# Patient Record
Sex: Female | Born: 1990 | Race: White | Hispanic: No | Marital: Single | Smoking: Never smoker
Health system: Southern US, Community
[De-identification: ages and names within clinical notes are randomized; demographics above are authoritative.]

## PROBLEM LIST (undated history)

## (undated) DIAGNOSIS — E079 Disorder of thyroid, unspecified: Secondary | ICD-10-CM

---

## 2020-06-30 DIAGNOSIS — Z7689 Persons encountering health services in other specified circumstances: Secondary | ICD-10-CM | POA: Diagnosis not present

## 2020-06-30 DIAGNOSIS — Z1329 Encounter for screening for other suspected endocrine disorder: Secondary | ICD-10-CM | POA: Diagnosis not present

## 2020-06-30 DIAGNOSIS — Z13220 Encounter for screening for lipoid disorders: Secondary | ICD-10-CM | POA: Diagnosis not present

## 2020-06-30 DIAGNOSIS — E039 Hypothyroidism, unspecified: Secondary | ICD-10-CM | POA: Diagnosis not present

## 2020-06-30 DIAGNOSIS — F419 Anxiety disorder, unspecified: Secondary | ICD-10-CM | POA: Diagnosis not present

## 2020-06-30 DIAGNOSIS — B001 Herpesviral vesicular dermatitis: Secondary | ICD-10-CM | POA: Diagnosis not present

## 2020-06-30 DIAGNOSIS — Z6841 Body Mass Index (BMI) 40.0 and over, adult: Secondary | ICD-10-CM | POA: Diagnosis not present

## 2020-06-30 DIAGNOSIS — Z23 Encounter for immunization: Secondary | ICD-10-CM | POA: Diagnosis not present

## 2020-07-04 DIAGNOSIS — Z419 Encounter for procedure for purposes other than remedying health state, unspecified: Secondary | ICD-10-CM | POA: Diagnosis not present

## 2020-07-22 DIAGNOSIS — E039 Hypothyroidism, unspecified: Secondary | ICD-10-CM | POA: Diagnosis not present

## 2020-07-22 DIAGNOSIS — Z13220 Encounter for screening for lipoid disorders: Secondary | ICD-10-CM | POA: Diagnosis not present

## 2020-07-26 DIAGNOSIS — F418 Other specified anxiety disorders: Secondary | ICD-10-CM | POA: Diagnosis not present

## 2020-07-26 DIAGNOSIS — E039 Hypothyroidism, unspecified: Secondary | ICD-10-CM | POA: Diagnosis not present

## 2020-07-26 DIAGNOSIS — Z0000 Encounter for general adult medical examination without abnormal findings: Secondary | ICD-10-CM | POA: Diagnosis not present

## 2020-07-26 DIAGNOSIS — Z975 Presence of (intrauterine) contraceptive device: Secondary | ICD-10-CM | POA: Diagnosis not present

## 2020-07-26 DIAGNOSIS — F419 Anxiety disorder, unspecified: Secondary | ICD-10-CM | POA: Diagnosis not present

## 2020-08-03 DIAGNOSIS — Z419 Encounter for procedure for purposes other than remedying health state, unspecified: Secondary | ICD-10-CM | POA: Diagnosis not present

## 2020-08-17 DIAGNOSIS — B9689 Other specified bacterial agents as the cause of diseases classified elsewhere: Secondary | ICD-10-CM | POA: Diagnosis not present

## 2020-08-17 DIAGNOSIS — Z124 Encounter for screening for malignant neoplasm of cervix: Secondary | ICD-10-CM | POA: Diagnosis not present

## 2020-08-17 DIAGNOSIS — Z975 Presence of (intrauterine) contraceptive device: Secondary | ICD-10-CM | POA: Diagnosis not present

## 2020-08-17 DIAGNOSIS — N644 Mastodynia: Secondary | ICD-10-CM | POA: Diagnosis not present

## 2020-08-17 DIAGNOSIS — N760 Acute vaginitis: Secondary | ICD-10-CM | POA: Diagnosis not present

## 2020-08-17 DIAGNOSIS — Z0001 Encounter for general adult medical examination with abnormal findings: Secondary | ICD-10-CM | POA: Diagnosis not present

## 2020-08-17 DIAGNOSIS — A5909 Other urogenital trichomoniasis: Secondary | ICD-10-CM | POA: Diagnosis not present

## 2020-08-17 DIAGNOSIS — Z113 Encounter for screening for infections with a predominantly sexual mode of transmission: Secondary | ICD-10-CM | POA: Diagnosis not present

## 2020-08-31 DIAGNOSIS — B349 Viral infection, unspecified: Secondary | ICD-10-CM | POA: Diagnosis not present

## 2020-08-31 DIAGNOSIS — Z20822 Contact with and (suspected) exposure to covid-19: Secondary | ICD-10-CM | POA: Diagnosis not present

## 2020-09-03 DIAGNOSIS — Z419 Encounter for procedure for purposes other than remedying health state, unspecified: Secondary | ICD-10-CM | POA: Diagnosis not present

## 2020-10-04 DIAGNOSIS — Z419 Encounter for procedure for purposes other than remedying health state, unspecified: Secondary | ICD-10-CM | POA: Diagnosis not present

## 2020-11-01 DIAGNOSIS — Z419 Encounter for procedure for purposes other than remedying health state, unspecified: Secondary | ICD-10-CM | POA: Diagnosis not present

## 2020-12-02 DIAGNOSIS — Z419 Encounter for procedure for purposes other than remedying health state, unspecified: Secondary | ICD-10-CM | POA: Diagnosis not present

## 2021-01-01 DIAGNOSIS — Z419 Encounter for procedure for purposes other than remedying health state, unspecified: Secondary | ICD-10-CM | POA: Diagnosis not present

## 2021-02-01 DIAGNOSIS — Z419 Encounter for procedure for purposes other than remedying health state, unspecified: Secondary | ICD-10-CM | POA: Diagnosis not present

## 2021-02-20 ENCOUNTER — Encounter: Payer: Self-pay | Admitting: Emergency Medicine

## 2021-02-20 ENCOUNTER — Other Ambulatory Visit: Payer: Self-pay

## 2021-02-20 ENCOUNTER — Ambulatory Visit
Admission: EM | Admit: 2021-02-20 | Discharge: 2021-02-20 | Disposition: A | Discharge status: Home / Self Care | Payer: PRIVATE HEALTH INSURANCE | Attending: Emergency Medicine | Admitting: Emergency Medicine

## 2021-02-20 DIAGNOSIS — J069 Acute upper respiratory infection, unspecified: Secondary | ICD-10-CM | POA: Insufficient documentation

## 2021-02-20 DIAGNOSIS — J029 Acute pharyngitis, unspecified: Secondary | ICD-10-CM | POA: Diagnosis present

## 2021-02-20 LAB — POCT RAPID STREP A (OFFICE): Rapid Strep A Screen: NEGATIVE

## 2021-02-20 MED ORDER — FLUTICASONE PROPIONATE 50 MCG/ACT NA SUSP: 1.0000 | Freq: Every day | NASAL | 0 refills | Status: AC

## 2021-02-20 MED ORDER — IBUPROFEN 600 MG PO TABS: 600.0000 mg | ORAL_TABLET | Freq: Four times a day (QID) | ORAL | 0 refills | Status: DC | PRN

## 2021-02-20 NOTE — Discharge Instructions (Addendum)
Strep negative, COVID test pending for confirmation Rest and fluids Flonase and daily antihistamine such as Zyrtec or Claritin to help with congestion and drainage Ibuprofen and Tylenol for throat pain May use other over-the-counter medicine to help with cough and congestion as needed or if preferred Follow-up for any concerns

## 2021-02-20 NOTE — ED Triage Notes (Signed)
Pt here for bilateral ear pain and nasal congestion with scratchy throat x 3 days; pt sts neg covid test yesterday

## 2021-02-21 NOTE — ED Provider Notes (Signed)
[facility]    CSN: [id number] Arrival date & time: 02/20/21  1939      History   Chief Complaint Chief Complaint  Patient presents with   Otalgia    HPI Diane Sandoval is a 30 y.o. female presenting today for evaluation of URI symptoms.  Reports ear pressure, sore throat on the left side and congestion for the past 3 days.  Took at home COVID test which was negative last night.  She denies any fevers, but has had some slight hot and cold chills.  Denies any cough, chest pain or shortness of breath.  Denies GI symptoms.  Denies known exposures, but does work at a [employer], [employer].  HPI  History reviewed. No pertinent past medical history.  There are no problems to display for this patient.   History reviewed. No pertinent surgical history.  OB History   No obstetric history on file.      Home Medications    Prior to Admission medications   Medication Sig Start Date End Date Taking? Authorizing Provider  fluticasone (FLONASE) 50 MCG/ACT nasal spray Place 1-2 sprays into both nostrils daily. 02/20/21  Yes Bambie Pizzolato C, PA-C  ibuprofen (ADVIL) 600 MG tablet Take 1 tablet (600 mg total) by mouth every 6 (six) hours as needed. 02/20/21  Yes Kevonte Vanecek, Junius Creamer, PA-C    Family History History reviewed. No pertinent family history.  Social History Social History   Tobacco Use   Smoking status: Never   Smokeless tobacco: Never  Substance Use Topics   Alcohol use: Not Currently   Drug use: Never     Allergies   Patient has no known allergies.   Review of Systems Review of Systems  Constitutional:  Negative for activity change, appetite change, chills, fatigue and fever.  HENT:  Positive for congestion, rhinorrhea, sinus pressure and sore throat. Negative for ear pain and trouble swallowing.   Eyes:  Negative for discharge and redness.  Respiratory:  Negative for cough, chest tightness and shortness of breath.   Cardiovascular:  Negative for chest  pain.  Gastrointestinal:  Negative for abdominal pain, diarrhea, nausea and vomiting.  Musculoskeletal:  Negative for myalgias.  Skin:  Negative for rash.  Neurological:  Negative for dizziness, light-headedness and headaches.    Physical Exam Triage Vital Signs ED Triage Vitals  Enc Vitals Group     BP 02/20/21 1951 135/86     Pulse Rate 02/20/21 1951 (!) 105     Resp 02/20/21 1951 18     Temp 02/20/21 1951 98.7 F (37.1 C)     Temp Source 02/20/21 1951 Oral     SpO2 02/20/21 1951 97 %     Weight --      Height --      Head Circumference --      Peak Flow --      Pain Score 02/20/21 1952 6     Pain Loc --      Pain Edu? --      Excl. in GC? --    No data found.  Updated Vital Signs BP 135/86 (BP Location: Left Arm)   Pulse (!) 105   Temp 98.7 F (37.1 C) (Oral)   Resp 18   SpO2 97%   Visual Acuity Right Eye Distance:   Left Eye Distance:   Bilateral Distance:    Right Eye Near:   Left Eye Near:    Bilateral Near:     Physical Exam Vitals and nursing  note reviewed.  Constitutional:      Appearance: She is well-developed.     Comments: No acute distress  HENT:     Head: Normocephalic and atraumatic.     Ears:     Comments: Bilateral ears without tenderness to palpation of external auricle, tragus and mastoid, EAC's without erythema or swelling, TM's with good bony landmarks and cone of light. Non erythematous.      Nose: Nose normal.     Mouth/Throat:     Comments: Oral mucosa pink and moist, no tonsillar enlargement or exudate. Posterior pharynx patent and nonerythematous, no uvula deviation or swelling. Normal phonation.  Eyes:     Conjunctiva/sclera: Conjunctivae normal.  Cardiovascular:     Rate and Rhythm: Normal rate and regular rhythm.  Pulmonary:     Effort: Pulmonary effort is normal. No respiratory distress.     Comments: Breathing comfortably at rest, CTABL, no wheezing, rales or other adventitious sounds auscultated  Abdominal:      General: There is no distension.  Musculoskeletal:        General: Normal range of motion.     Cervical back: Neck supple.  Skin:    General: Skin is warm and dry.  Neurological:     Mental Status: She is alert and oriented to person, place, and time.     UC Treatments / Results  Labs (all labs ordered are listed, but only abnormal results are displayed) Labs Reviewed  CULTURE, GROUP A STREP ([facility])  NOVEL CORONAVIRUS, NAA  POCT RAPID STREP A (OFFICE)    EKG   Radiology No results found.  Procedures Procedures (including critical care time)  Medications Ordered in UC Medications - No data to display  Initial Impression / Assessment and Plan / UC Course  I have reviewed the triage vital signs and the nursing notes.  Pertinent labs & imaging results that were available during my care of the patient were reviewed by me and considered in my medical decision making (see chart for details).     Viral URI-strep negative, COVID pending to confirm, recommending continued symptomatic and supportive care rest and fluids.  Discussed strict return precautions. Patient verbalized understanding and is agreeable with plan.  Final Clinical Impressions(s) / UC Diagnoses   Final diagnoses:  Viral URI with cough  Sore throat     Discharge Instructions      Strep negative, COVID test pending for confirmation Rest and fluids Flonase and daily antihistamine such as Zyrtec or Claritin to help with congestion and drainage Ibuprofen and Tylenol for throat pain May use other over-the-counter medicine to help with cough and congestion as needed or if preferred Follow-up for any concerns     ED Prescriptions     Medication Sig Dispense Auth. Provider   fluticasone (FLONASE) 50 MCG/ACT nasal spray Place 1-2 sprays into both nostrils daily. 16 g Esteban Kobashigawa C, PA-C   ibuprofen (ADVIL) 600 MG tablet Take 1 tablet (600 mg total) by mouth every 6 (six) hours as needed. 30 tablet  Andrell Tallman, [place] C, PA-C      PDMP not reviewed this encounter.   Lew Dawes, [place] 02/21/21 [phone]

## 2021-02-22 LAB — NOVEL CORONAVIRUS, NAA: SARS-CoV-2, NAA: NOT DETECTED

## 2021-02-22 LAB — SARS-COV-2, NAA 2 DAY TAT

## 2021-02-24 LAB — CULTURE, GROUP A STREP (THRC)

## 2021-03-03 DIAGNOSIS — Z419 Encounter for procedure for purposes other than remedying health state, unspecified: Secondary | ICD-10-CM | POA: Diagnosis not present

## 2021-04-03 DIAGNOSIS — Z419 Encounter for procedure for purposes other than remedying health state, unspecified: Secondary | ICD-10-CM | POA: Diagnosis not present

## 2021-05-04 DIAGNOSIS — Z419 Encounter for procedure for purposes other than remedying health state, unspecified: Secondary | ICD-10-CM | POA: Diagnosis not present

## 2021-05-21 ENCOUNTER — Other Ambulatory Visit: Payer: Self-pay

## 2021-05-21 ENCOUNTER — Emergency Department (HOSPITAL_BASED_OUTPATIENT_CLINIC_OR_DEPARTMENT_OTHER): Payer: PRIVATE HEALTH INSURANCE

## 2021-05-21 ENCOUNTER — Emergency Department (HOSPITAL_BASED_OUTPATIENT_CLINIC_OR_DEPARTMENT_OTHER)
Admission: EM | Admit: 2021-05-21 | Discharge: 2021-05-21 | Disposition: A | Discharge status: Home / Self Care | Payer: PRIVATE HEALTH INSURANCE | Attending: Emergency Medicine | Admitting: Emergency Medicine

## 2021-05-21 ENCOUNTER — Encounter (HOSPITAL_BASED_OUTPATIENT_CLINIC_OR_DEPARTMENT_OTHER): Payer: Self-pay | Admitting: Emergency Medicine

## 2021-05-21 DIAGNOSIS — R112 Nausea with vomiting, unspecified: Secondary | ICD-10-CM | POA: Insufficient documentation

## 2021-05-21 DIAGNOSIS — R1013 Epigastric pain: Secondary | ICD-10-CM | POA: Diagnosis not present

## 2021-05-21 DIAGNOSIS — R197 Diarrhea, unspecified: Secondary | ICD-10-CM | POA: Insufficient documentation

## 2021-05-21 HISTORY — DX: Disorder of thyroid, unspecified: E07.9

## 2021-05-21 LAB — COMPREHENSIVE METABOLIC PANEL
ALT: 26 U/L (ref 0–44)
AST: 22 U/L (ref 15–41)
Albumin: 4.2 g/dL (ref 3.5–5.0)
Alkaline Phosphatase: 67 U/L (ref 38–126)
Anion gap: 7 (ref 5–15)
BUN: 9 mg/dL (ref 6–20)
CO2: 27 mmol/L (ref 22–32)
Calcium: 9.5 mg/dL (ref 8.9–10.3)
Chloride: 103 mmol/L (ref 98–111)
Creatinine, Ser: 0.82 mg/dL (ref 0.44–1.00)
GFR, Estimated: >60 mL/min (ref >60)
Glucose, Bld: 83 mg/dL (ref 70–99)
Potassium: 4.1 mmol/L (ref 3.5–5.1)
Sodium: 137 mmol/L (ref 135–145)
Total Bilirubin: 0.4 mg/dL (ref 0.3–1.2)
Total Protein: 7.1 g/dL (ref 6.5–8.1)

## 2021-05-21 LAB — URINALYSIS, ROUTINE W REFLEX MICROSCOPIC
Bilirubin Urine: NEGATIVE
Glucose, UA: NEGATIVE mg/dL
Ketones, ur: NEGATIVE mg/dL
Nitrite: NEGATIVE
Protein, ur: NEGATIVE mg/dL
Specific Gravity, Urine: 1.015 (ref 1.005–1.030)
pH: 6.5 (ref 5.0–8.0)

## 2021-05-21 LAB — URINALYSIS, MICROSCOPIC (REFLEX)

## 2021-05-21 LAB — CBC
HCT: 39.5 % (ref 36.0–46.0)
Hemoglobin: 13.2 g/dL (ref 12.0–15.0)
MCH: 30.4 pg (ref 26.0–34.0)
MCHC: 33.4 g/dL (ref 30.0–36.0)
MCV: 91 fL (ref 80.0–100.0)
Platelets: 264 10*3/uL (ref 150–400)
RBC: 4.34 MIL/uL (ref 3.87–5.11)
RDW: 12.9 % (ref 11.5–15.5)
WBC: 6.8 10*3/uL (ref 4.0–10.5)
nRBC: 0 % (ref 0.0–0.2)

## 2021-05-21 LAB — LIPASE, BLOOD: Lipase: <10 U/L — ABNORMAL LOW (ref 11–51)

## 2021-05-21 LAB — PREGNANCY, URINE: Preg Test, Ur: NEGATIVE

## 2021-05-21 MED ORDER — SODIUM CHLORIDE 0.9 % IV BOLUS
1000.0000 mL | Freq: Once | INTRAVENOUS | Status: AC
  Administered 2021-05-21: 1000 mL via INTRAVENOUS

## 2021-05-21 MED ORDER — SODIUM CHLORIDE 0.9 % IV BOLUS (SEPSIS): 1000.0000 mL | Freq: Once | INTRAVENOUS | Status: DC

## 2021-05-21 MED ORDER — IOHEXOL 350 MG/ML SOLN
75.0000 mL | Freq: Once | INTRAVENOUS | Status: AC | PRN
  Administered 2021-05-21: 75 mL via INTRAVENOUS

## 2021-05-21 MED ORDER — SODIUM CHLORIDE 0.9 % IV SOLN: 1000.0000 mL | INTRAVENOUS | Status: DC

## 2021-05-21 MED ORDER — ONDANSETRON HCL 4 MG/2ML IJ SOLN
4.0000 mg | Freq: Once | INTRAMUSCULAR | Status: AC
  Administered 2021-05-21: 4 mg via INTRAVENOUS
  Filled 2021-05-21: qty 2

## 2021-05-21 MED ORDER — MORPHINE SULFATE (PF) 2 MG/ML IV SOLN
2.0000 mg | Freq: Once | INTRAVENOUS | Status: AC
  Administered 2021-05-21: 2 mg via INTRAVENOUS
  Filled 2021-05-21: qty 1

## 2021-05-21 NOTE — ED Triage Notes (Signed)
Pt started wegovy a week ago.s he was started on large dose (1.7),30 minutes after pt was projectile vomiting. Pt is bloated, gassy, crampy,sore abdomen,cant eat because of  nausea. Pt worried she has pancreatitis which is listed as a potential side effect. Pt endorses abdominal pain across lower quadrants.

## 2021-05-21 NOTE — ED Provider Notes (Signed)
[facility] EMERGENCY DEPT Provider Note   CSN: [id number] Arrival date & time: 05/21/21  4098     History Chief Complaint  Patient presents with   Abdominal Pain    Diane Sandoval is a 30 y.o. female.  HPI 30 year old female G1, P1 LMP last year status post IUD placement presents today complaining of abdominal pain, nausea, vomiting, diarrhea.  She states she took her first dose of a weight loss medication last Sunday.  It is an injectable medication, regularly.  She states that after that for 48 hours she had nausea, vomiting, diarrhea.  After that resolved she began having crampy upper abdominal pain.  She is concerned that she has pancreatitis is this is listed as a side effect of the medication.  She has moderate epigastric crampy type pain.  She has not been able to take anything that has resolved this.  She has had decreased food intake as it does worsen with food.    Past Medical History:  Diagnosis Date   Thyroid disease     There are no problems to display for this patient.   History reviewed. No pertinent surgical history.   OB History   No obstetric history on file.     History reviewed. No pertinent family history.  Social History   Tobacco Use   Smoking status: Never   Smokeless tobacco: Never  Substance Use Topics   Alcohol use: Not Currently   Drug use: Never    Home Medications Prior to Admission medications   Medication Sig Start Date End Date Taking? Authorizing Provider  ALPRAZolam Prudy Feeler) 0.5 MG tablet Take 0.5 mg by mouth at bedtime as needed for anxiety.   Yes [provider]  DULoxetine (CYMBALTA) 30 MG capsule Take 30 mg by mouth daily.   Yes [provider]  fluticasone (FLONASE) 50 MCG/ACT nasal spray Place 1-2 sprays into both nostrils daily. 02/20/21  Yes Wieters, Hallie C, PA-C  ibuprofen (ADVIL) 600 MG tablet Take 1 tablet (600 mg total) by mouth every 6 (six) hours as needed. 02/20/21  Yes Wieters,  Hallie C, PA-C  levothyroxine (SYNTHROID) 100 MCG tablet Take 100 mcg by mouth daily before breakfast.   Yes [provider]  valACYclovir (VALTREX) 500 MG tablet Take 500 mg by mouth 2 (two) times daily.   Yes [provider]  Levonorgestrel (KYLEENA IU) by Intrauterine route.    [provider]    Allergies    Clindamycin/lincomycin and Doxycycline  Review of Systems   Review of Systems  All other systems reviewed and are negative.  Physical Exam Updated Vital Signs BP 103/72   Pulse 79   Temp 98.1 F (36.7 C) (Oral)   Resp 15   Ht 1.626 m (5\' 4" )   Wt 127 kg   SpO2 98%   BMI 48.06 kg/m   Physical Exam Vitals reviewed.  Constitutional:      General: She is not in acute distress.    Appearance: She is well-developed. She is obese.  HENT:     Head: Normocephalic.     Mouth/Throat:     Mouth: Mucous membranes are moist.  Eyes:     Extraocular Movements: Extraocular movements intact.  Cardiovascular:     Rate and Rhythm: Normal rate and regular rhythm.  Pulmonary:     Effort: Pulmonary effort is normal.     Breath sounds: Normal breath sounds.  Abdominal:     General: Abdomen is protuberant. Bowel sounds are normal. There is  no distension.     Palpations: Abdomen is soft.     Tenderness: There is abdominal tenderness in the epigastric area.  Skin:    General: Skin is warm.     Capillary Refill: Capillary refill takes less than 2 seconds.  Neurological:     General: No focal deficit present.     Mental Status: She is alert.  Psychiatric:        Mood and Affect: Mood normal.    ED Results / Procedures / Treatments   Labs (all labs ordered are listed, but only abnormal results are displayed) Labs Reviewed - No data to display  EKG EKG Interpretation  Date/Time:  Sunday May 21 2021 09:58:40 EDT Ventricular Rate:  81 PR Interval:  160 QRS Duration: 103 QT Interval:  376 QTC Calculation: 437 R Axis:   86 Text  Interpretation: Sinus rhythm No old tracing to compare Confirmed by Glenys Snader ([id number]) on 05/21/2021 10:03:53 AM  Radiology CT ABDOMEN PELVIS W CONTRAST  Result Date: 05/21/2021 CLINICAL DATA:  29 year old female with acute abdominal and pelvic pain with vomiting. EXAM: CT ABDOMEN AND PELVIS WITH CONTRAST TECHNIQUE: Multidetector CT imaging of the abdomen and pelvis was performed using the standard protocol following bolus administration of intravenous contrast. CONTRAST:  75mL OMNIPAQUE IOHEXOL 350 MG/ML SOLN COMPARISON:  None. FINDINGS: Lower chest: No acute abnormality. Hepatobiliary: The liver is unremarkable. The patient is status post cholecystectomy. No biliary dilatation. Pancreas: Unremarkable Spleen: Unremarkable Adrenals/Urinary Tract: The kidneys, adrenal glands and bladder are unremarkable. Stomach/Bowel: There are mildly distended loops of proximal and mid small bowel. There is a segment of small bowel with mildly thickened walls within the mid abdomen (series 2: Image 41-47). Distal small bowel loops are collapsed but no discrete transition point is identified. No other bowel abnormalities are identified. Gas and stool within the colon is identified. Vascular/Lymphatic: No significant vascular findings are present. No enlarged abdominal or pelvic lymph nodes. Reproductive: An IUD is present.  No adnexal masses are identified. Other: No ascites, focal collection or pneumoperitoneum. Musculoskeletal: No acute or suspicious bony abnormalities are noted. IMPRESSION: 1. Mildly distended loops of proximal and mid small bowel with collapsed distal small bowel loops, question enteritis/ileus versus developing small bowel obstruction. No ascites, abscess or pneumoperitoneum. 2. No other acute or significant abnormalities. Electronically Signed   By: Jeffrey  Hu M.D.   On: 05/21/2021 13:41    Procedures Procedures   Medications Ordered in ED Medications  sodium chloride 0.9 % bolus 1,000 mL (has  no administration in time range)    Followed by  sodium chloride 0.9 % bolus 1,000 mL (has no administration in time range)    Followed by  0.9 %  sodium chloride infusion (has no administration in time range)    ED Course  I have reviewed the triage vital signs and the nursing notes.  Pertinent labs & imaging results that were available during my care of the patient were reviewed by me and considered in my medical decision making (see chart for details).  Clinical Course as of 05/21/21 1353  Sun May 21, 2021  1129 Labs reviewed and interpreted including CBC, lipase, and complete chemistry with LFTs are all within normal limit [DR]    Clinical Course User Index [DR] Sharin Altidor, MD   MDM Rules/Calculators/A&P                          29  year old female started new medication for obesity  last Sunday.  She had epigastric pain with nausea and vomiting.  She has had a cholecystectomy in the past.  Labs were obtained and all are within normal limits including normal white count, normal LFTs, normal lipase.  CT obtained. Reviewed CT scan Reexamined patient Her abdomen is soft with mild epigastric tenderness.  She has not had vomiting since Tuesday.  She has had oral intake and has had bowel movements and is passing gas. I doubt bowel obstruction.  Plan oral trial here in ED.  She is able to tolerate liquids and crackers, will discharge home with antiemetics and advised on close follow-up. Final Clinical Impression(s) / ED Diagnoses Final diagnoses:  Non-intractable vomiting with nausea, unspecified vomiting type  Epigastric pain    Rx / DC Orders ED Discharge Orders     None        Margarita Grizzle, MD 05/21/21 1418

## 2021-05-21 NOTE — ED Notes (Signed)
Given crackers and soda 

## 2021-05-21 NOTE — Discharge Instructions (Signed)
Your abdominal pain nausea vomiting were evaluated here in the emergency department today with labs.  Your blood count, chemistries, and lipase are all within normal limits.  The CAT scan of your abdomen did show some dilated small bowel loops.  This could represent early obstruction, but given your severity of symptoms was worse earlier in the week and they have been better, I doubt this is the case.  You are being sent home with some nausea medicines and advised to have a low-fat and mostly liquid diet over the next 24 hours.  You can then begin to progress to regular diet.  If you begin having any worsening abdominal pain or not tolerating fluids, please return immediately to the emergency department for reevaluation.  Additionally should be reevaluated by your primary care provider within the next several days.

## 2021-06-03 DIAGNOSIS — Z419 Encounter for procedure for purposes other than remedying health state, unspecified: Secondary | ICD-10-CM | POA: Diagnosis not present

## 2021-06-10 ENCOUNTER — Encounter (HOSPITAL_BASED_OUTPATIENT_CLINIC_OR_DEPARTMENT_OTHER): Payer: Self-pay | Admitting: Emergency Medicine

## 2021-06-10 ENCOUNTER — Emergency Department (HOSPITAL_BASED_OUTPATIENT_CLINIC_OR_DEPARTMENT_OTHER)
Admission: EM | Admit: 2021-06-10 | Discharge: 2021-06-11 | Disposition: A | Discharge status: Home / Self Care | Payer: No Typology Code available for payment source | Attending: Emergency Medicine | Admitting: Emergency Medicine

## 2021-06-10 ENCOUNTER — Other Ambulatory Visit: Payer: Self-pay

## 2021-06-10 DIAGNOSIS — K645 Perianal venous thrombosis: Secondary | ICD-10-CM | POA: Insufficient documentation

## 2021-06-10 DIAGNOSIS — K649 Unspecified hemorrhoids: Secondary | ICD-10-CM

## 2021-06-10 DIAGNOSIS — K625 Hemorrhage of anus and rectum: Secondary | ICD-10-CM | POA: Diagnosis present

## 2021-06-10 NOTE — ED Triage Notes (Signed)
Reports bright red rectal bleed. Hx hemorrhoids. Rectal pain

## 2021-06-11 MED ORDER — HYDROCORTISONE (PERIANAL) 2.5 % EX CREA: 1.0000 "application " | TOPICAL_CREAM | Freq: Two times a day (BID) | CUTANEOUS | 0 refills | Status: AC

## 2021-06-11 MED ORDER — LIDOCAINE 5 % EX CREA: TOPICAL_CREAM | CUTANEOUS | 0 refills | Status: AC

## 2021-06-11 MED ORDER — FINGER COTS MISC: 0 refills | Status: AC

## 2021-06-11 NOTE — ED Notes (Signed)
Pt verbalizes understanding of discharge instructions. Opportunity for questioning and answers were provided. Armand removed by staff, pt discharged from ED to home. Educated to use cream as directed.

## 2021-06-11 NOTE — ED Provider Notes (Signed)
[facility] EMERGENC[facility] Provider Note: Diane Dell, MD, FACEP  CSN: [id number] MRN: [id number] ARRIVAL: 06/10/21 at 2124 ROOM: DB014/DB014   CHIEF COMPLAINT  Rectal Bleeding (Bright red)   HISTORY OF PRESENT ILLNESS  06/11/21 12:07 AM Diane Sandoval is a 30 y.o. female who noticed bright red blood per rectum when she had a bowel movement yesterday afternoon.  There was associated pain which she rates as a 7 out of 10.  Pain is worse with bowel movements.  She has no history of hemorrhoids or other rectal pathology.  She is not lightheaded.  The amount of bleeding was moderate.   Past Medical History:  Diagnosis Date   Thyroid disease     History reviewed. No pertinent surgical history.  No family history on file.  Social History   Tobacco Use   Smoking status: Never   Smokeless tobacco: Never  Substance Use Topics   Alcohol use: Not Currently   Drug use: Never    Prior to Admission medications   Medication Sig Start Date End Date Taking? Authorizing Provider  Elastic Bandages & Supports (FINGER COTS) MISC Use as needed for application of medication to rectal area. 06/11/21  Yes Chaya Dehaan, MD  hydrocortisone (ANUSOL-HC) 2.5 % rectal cream Place 1 application rectally 2 (two) times daily. 06/11/21  Yes Andry Bogden, MD  Lidocaine 5 % CREA Apply to rectal area as needed for pain. 06/11/21  Yes Skyler Carel, MD  ALPRAZolam Prudy Feeler) 0.5 MG tablet Take 0.5 mg by mouth at bedtime as needed for anxiety.    [provider]  DULoxetine (CYMBALTA) 30 MG capsule Take 30 mg by mouth daily.    [provider]  fluticasone (FLONASE) 50 MCG/ACT nasal spray Place 1-2 sprays into both nostrils daily. 02/20/21   Wieters, Hallie C, PA-C  ibuprofen (ADVIL) 600 MG tablet Take 1 tablet (600 mg total) by mouth every 6 (six) hours as needed. 02/20/21   Wieters, Hallie C, PA-C  Levonorgestrel (KYLEENA IU) by Intrauterine route.    [provider]  levothyroxine (SYNTHROID)  100 MCG tablet Take 100 mcg by mouth daily before breakfast.    [provider]  valACYclovir (VALTREX) 500 MG tablet Take 500 mg by mouth 2 (two) times daily.    [provider]    Allergies Clindamycin/lincomycin and Doxycycline   REVIEW OF SYSTEMS  Negative except as noted here or in the History of Present Illness.   PHYSICAL EXAMINATION  Initial Vital Signs Blood pressure 124/90, pulse 76, temperature 98.1 F (36.7 C), resp. rate 18, height 5\' 4"  (1.626 m), weight 131.5 kg, SpO2 98 %.  Examination General: Well-developed, well-nourished female in no acute distress; appearance consistent with age of record HENT: normocephalic; atraumatic Eyes: Normal appearance Neck: supple Heart: regular rate and rhythm Lungs: clear to auscultation bilaterally Abdomen: soft; nondistended; nontender; bowel sounds present Rectal: Tender, slightly thrombosed, bleeding hemorrhoid of the right side of the anus Extremities: No deformity; full range of motion; pulses normal Neurologic: Awake, alert and oriented; motor function intact in all extremities and symmetric; no facial droop Skin: Warm and dry Psychiatric: Normal mood and affect   RESULTS  Summary of this visit's results, reviewed and interpreted by myself:   EKG Interpretation  Date/Time:    Ventricular Rate:    PR Interval:    QRS Duration:   QT Interval:    QTC Calculation:   R Axis:     Text Interpretation:         Laboratory  Studies: No results found for this or any previous visit (from the past 24 hour(s)). Imaging Studies: No results found.  ED COURSE and MDM  Nursing notes, initial and subsequent vitals signs, including pulse oximetry, reviewed and interpreted by myself.  Vitals:   06/10/21 2133 06/10/21 2232 06/10/21 2300 06/11/21 0000  BP: 122/81 110/79 115/79 124/90  Pulse: 93 85 75 76  Resp: 14 16 17 18   Temp: 98.1 F (36.7 C)     SpO2: 97% 98% 98% 98%  Weight:      Height:        Medications - No data to display  The hemorrhoid looks only mildly thrombosed and I do not believe thrombectomy is indicated at this time.  We will treat with topical steroids and analgesia and refer to surgery if symptoms persist or worsen.  PROCEDURES  Procedures   ED DIAGNOSES     ICD-10-CM   1. Bleeding hemorrhoid  K64.9          Chade Pitner, , MD 06/11/21 [phone]

## 2021-07-04 DIAGNOSIS — Z419 Encounter for procedure for purposes other than remedying health state, unspecified: Secondary | ICD-10-CM | POA: Diagnosis not present

## 2021-08-03 DIAGNOSIS — Z419 Encounter for procedure for purposes other than remedying health state, unspecified: Secondary | ICD-10-CM | POA: Diagnosis not present

## 2021-09-03 DIAGNOSIS — Z419 Encounter for procedure for purposes other than remedying health state, unspecified: Secondary | ICD-10-CM | POA: Diagnosis not present

## 2021-10-04 DIAGNOSIS — Z419 Encounter for procedure for purposes other than remedying health state, unspecified: Secondary | ICD-10-CM | POA: Diagnosis not present

## 2021-11-01 DIAGNOSIS — Z419 Encounter for procedure for purposes other than remedying health state, unspecified: Secondary | ICD-10-CM | POA: Diagnosis not present

## 2021-12-02 DIAGNOSIS — Z419 Encounter for procedure for purposes other than remedying health state, unspecified: Secondary | ICD-10-CM | POA: Diagnosis not present

## 2022-01-01 DIAGNOSIS — Z419 Encounter for procedure for purposes other than remedying health state, unspecified: Secondary | ICD-10-CM | POA: Diagnosis not present

## 2022-02-01 DIAGNOSIS — Z419 Encounter for procedure for purposes other than remedying health state, unspecified: Secondary | ICD-10-CM | POA: Diagnosis not present

## 2022-03-03 DIAGNOSIS — Z419 Encounter for procedure for purposes other than remedying health state, unspecified: Secondary | ICD-10-CM | POA: Diagnosis not present

## 2022-04-03 DIAGNOSIS — Z419 Encounter for procedure for purposes other than remedying health state, unspecified: Secondary | ICD-10-CM | POA: Diagnosis not present

## 2022-05-04 DIAGNOSIS — Z419 Encounter for procedure for purposes other than remedying health state, unspecified: Secondary | ICD-10-CM | POA: Diagnosis not present

## 2022-06-03 DIAGNOSIS — Z419 Encounter for procedure for purposes other than remedying health state, unspecified: Secondary | ICD-10-CM | POA: Diagnosis not present

## 2022-07-04 DIAGNOSIS — Z419 Encounter for procedure for purposes other than remedying health state, unspecified: Secondary | ICD-10-CM | POA: Diagnosis not present

## 2022-08-03 DIAGNOSIS — Z419 Encounter for procedure for purposes other than remedying health state, unspecified: Secondary | ICD-10-CM | POA: Diagnosis not present

## 2022-09-03 DIAGNOSIS — Z419 Encounter for procedure for purposes other than remedying health state, unspecified: Secondary | ICD-10-CM | POA: Diagnosis not present

## 2022-10-04 DIAGNOSIS — Z419 Encounter for procedure for purposes other than remedying health state, unspecified: Secondary | ICD-10-CM | POA: Diagnosis not present

## 2022-11-01 IMAGING — CT CT ABD-PELV W/ CM
2 of 4 series · 17 of 46 positions shown, 19 images · IV contrast (APPLIED)
Comparison: None.

CLINICAL DATA: 29-year-old female with acute abdominal and pelvic
pain with vomiting.

EXAM:
CT ABDOMEN AND PELVIS WITH CONTRAST
TECHNIQUE: Multidetector CT imaging of the abdomen and pelvis was performed
using the standard protocol following bolus administration of
intravenous contrast.
CONTRAST:  75mL OMNIPAQUE IOHEXOL 350 MG/ML SOLN

[Series 2: abd pel w · axial · 0.98mm/px · z∈[+588,+1013]mm · 14 of 93 slices shown, 16 images]
[im 4/93  soft-tissue]
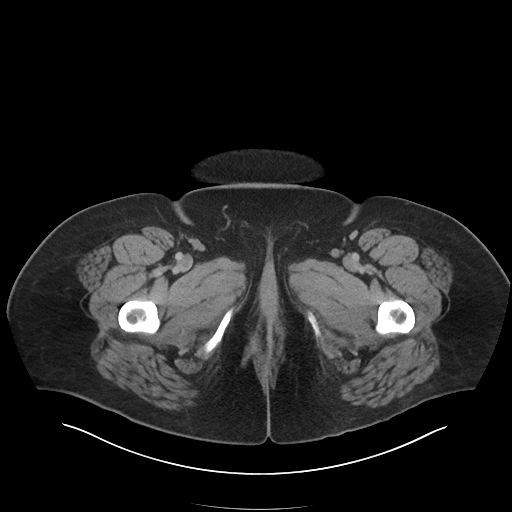
[im 4/93  bone]
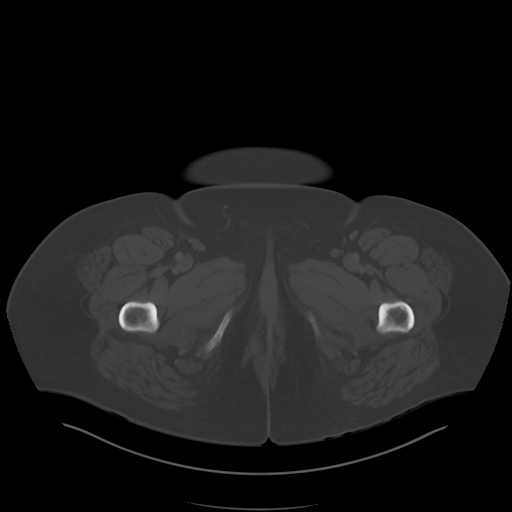
[im 12/93  soft-tissue]
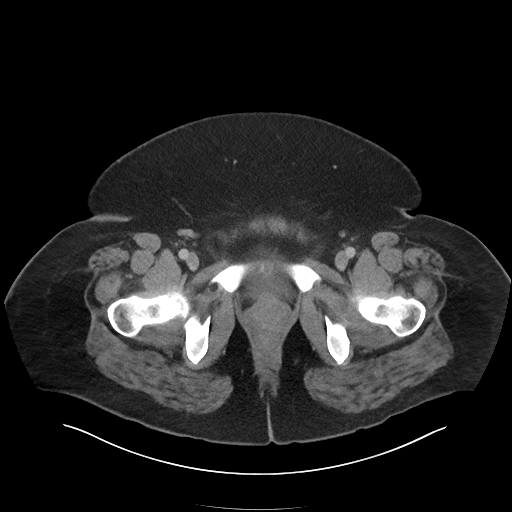
[im 19/93  soft-tissue]
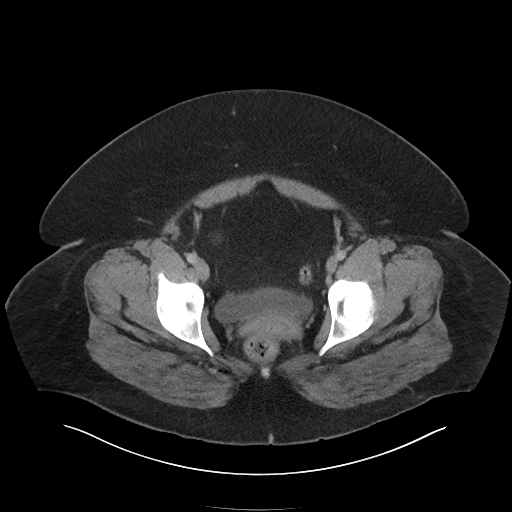
[im 26/93  soft-tissue]
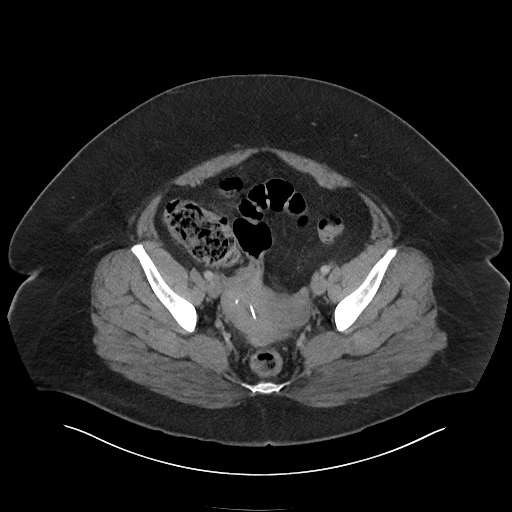
[im 30/93  soft-tissue]
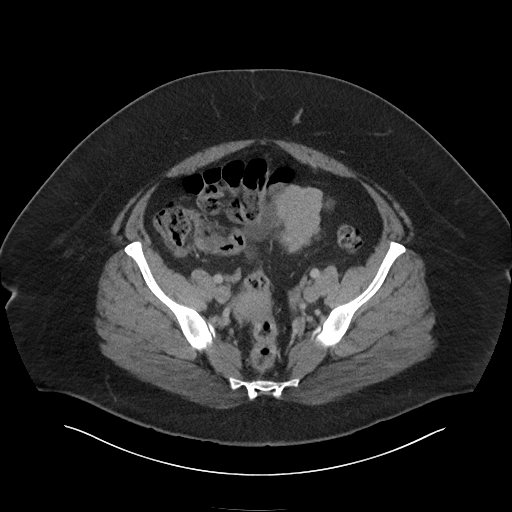
[im 37/93  soft-tissue]
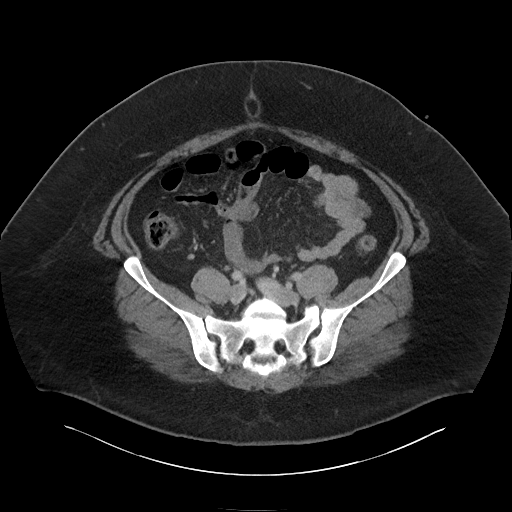
[im 45/93  soft-tissue]
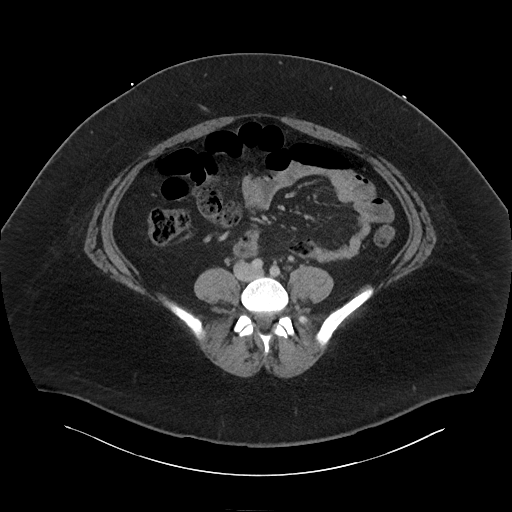
[im 48/93  soft-tissue]
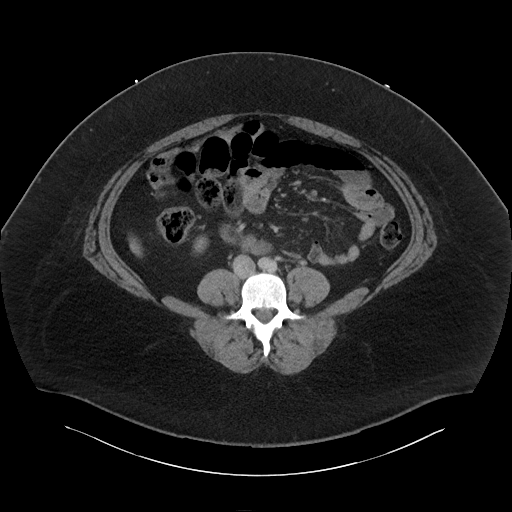
[im 56/93  soft-tissue]
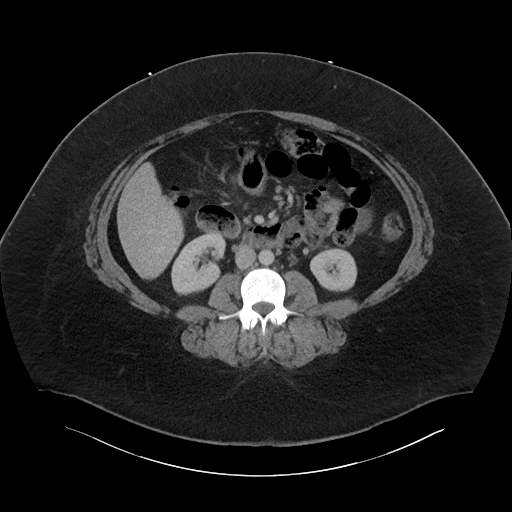
[im 56/93  bone]
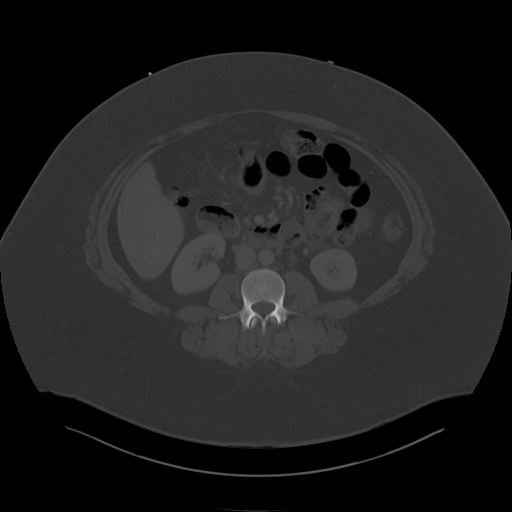
[im 63/93  soft-tissue]
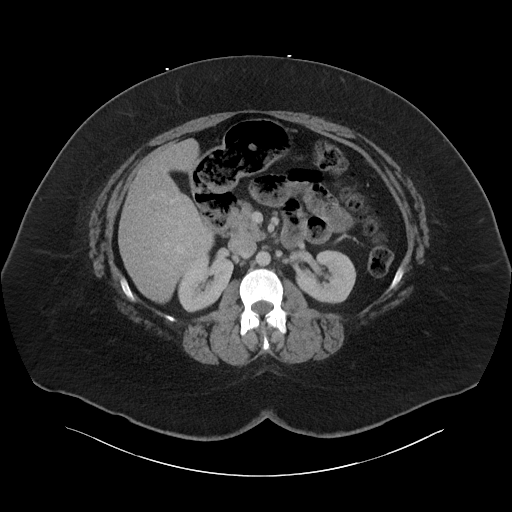
[im 70/93  soft-tissue]
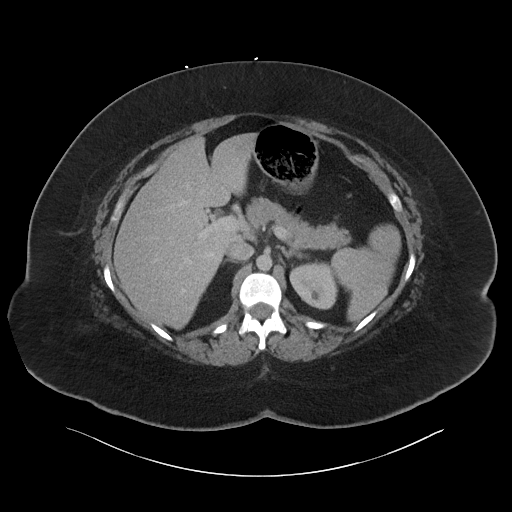
[im 74/93  soft-tissue]
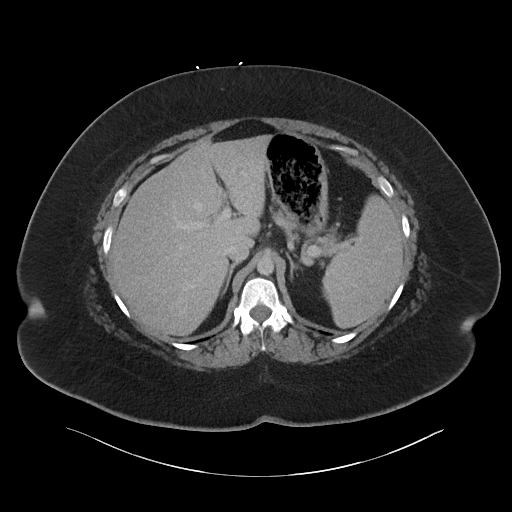
[im 81/93  soft-tissue]
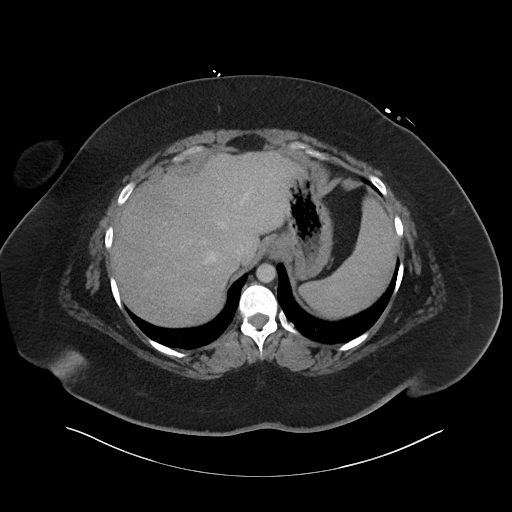
[im 89/93  soft-tissue]
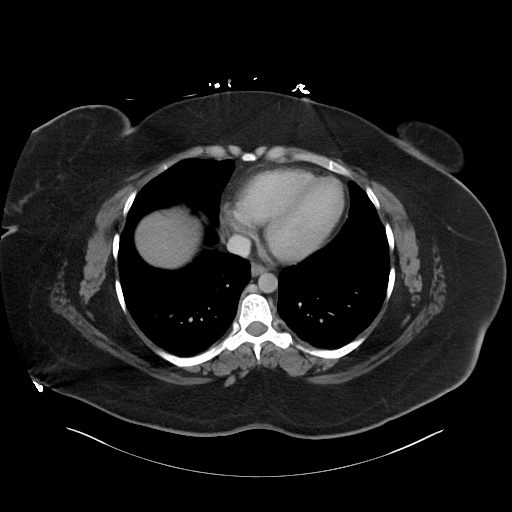

[Series 5: coronal · coronal · 0.91mm/px · 3 of 121 slices shown]
[im 41/121  soft-tissue]
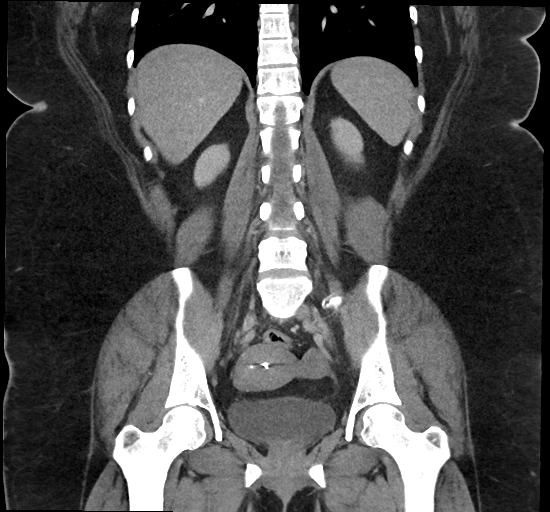
[im 54/121  soft-tissue]
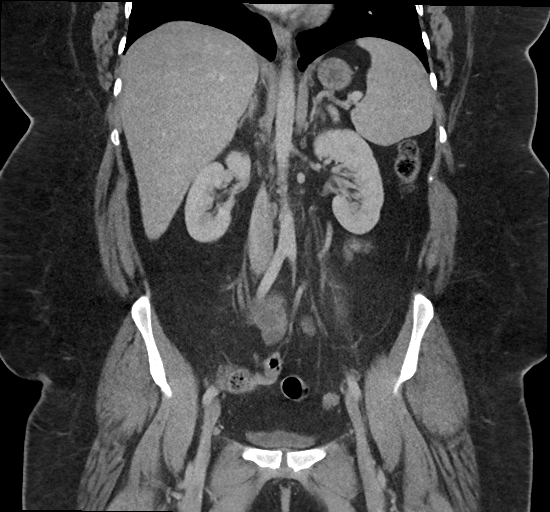
[im 67/121  soft-tissue]
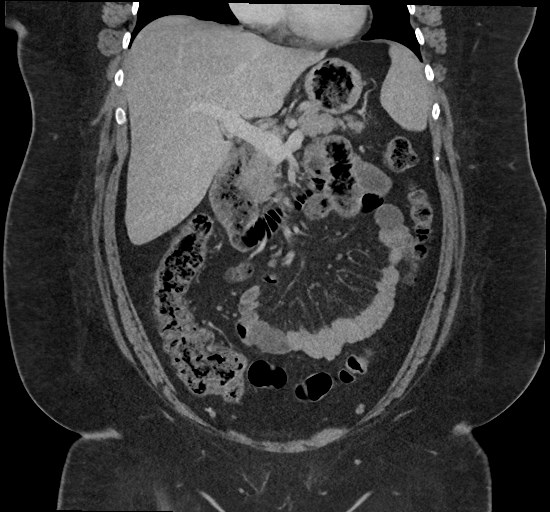

[17 of 46 positions shown; findings below may reference images not displayed]

FINDINGS: Lower chest: No acute abnormality.

Hepatobiliary: The liver is unremarkable. The patient is status post
cholecystectomy. No biliary dilatation.

Pancreas: Unremarkable

Spleen: Unremarkable

Adrenals/Urinary Tract: The kidneys, adrenal glands and bladder are
unremarkable.

Stomach/Bowel: There are mildly distended loops of proximal and mid
small bowel. There is a segment of small bowel with mildly thickened
walls within the mid abdomen (series 2: Image 41-47). Distal small
bowel loops are collapsed but no discrete transition point is
identified.

No other bowel abnormalities are identified. Gas and stool within
the colon is identified.

Vascular/Lymphatic: No significant vascular findings are present. No
enlarged abdominal or pelvic lymph nodes.

Reproductive: An IUD is present.  No adnexal masses are identified.

Other: No ascites, focal collection or pneumoperitoneum.

Musculoskeletal: No acute or suspicious bony abnormalities are
noted.
IMPRESSION: 1. Mildly distended loops of proximal and mid small bowel with
collapsed distal small bowel loops, question enteritis/ileus versus
developing small bowel obstruction. No ascites, abscess or
pneumoperitoneum.
2. No other acute or significant abnormalities.

## 2022-11-02 DIAGNOSIS — Z419 Encounter for procedure for purposes other than remedying health state, unspecified: Secondary | ICD-10-CM | POA: Diagnosis not present

## 2022-12-03 DIAGNOSIS — Z419 Encounter for procedure for purposes other than remedying health state, unspecified: Secondary | ICD-10-CM | POA: Diagnosis not present

## 2023-05-04 ENCOUNTER — Emergency Department (HOSPITAL_BASED_OUTPATIENT_CLINIC_OR_DEPARTMENT_OTHER): Payer: PRIVATE HEALTH INSURANCE

## 2023-05-04 ENCOUNTER — Other Ambulatory Visit: Payer: Self-pay

## 2023-05-04 DIAGNOSIS — S8992XA Unspecified injury of left lower leg, initial encounter: Secondary | ICD-10-CM | POA: Diagnosis present

## 2023-05-04 DIAGNOSIS — W1840XA Slipping, tripping and stumbling without falling, unspecified, initial encounter: Secondary | ICD-10-CM | POA: Insufficient documentation

## 2023-05-04 DIAGNOSIS — S8392XA Sprain of unspecified site of left knee, initial encounter: Secondary | ICD-10-CM | POA: Diagnosis not present

## 2023-05-04 NOTE — ED Triage Notes (Signed)
Pt states that she slipped going down steps earlier today and caught herself with her left leg causing pain in left knee. Denies swelling.

## 2023-05-04 NOTE — ED Notes (Signed)
Pt given ice pack while waiting for room

## 2023-05-05 ENCOUNTER — Emergency Department (HOSPITAL_BASED_OUTPATIENT_CLINIC_OR_DEPARTMENT_OTHER)
Admission: EM | Admit: 2023-05-05 | Discharge: 2023-05-05 | Disposition: A | Discharge status: Home / Self Care | Payer: PRIVATE HEALTH INSURANCE | Attending: Emergency Medicine | Admitting: Emergency Medicine

## 2023-05-05 DIAGNOSIS — S8392XA Sprain of unspecified site of left knee, initial encounter: Secondary | ICD-10-CM | POA: Diagnosis not present

## 2023-05-05 MED ORDER — OXYCODONE-ACETAMINOPHEN 5-325 MG PO TABS
2.0000 | ORAL_TABLET | Freq: Once | ORAL | Status: AC
  Administered 2023-05-05: 2 via ORAL
  Filled 2023-05-05: qty 2

## 2023-05-05 MED ORDER — KETOROLAC TROMETHAMINE 60 MG/2ML IM SOLN
30.0000 mg | Freq: Once | INTRAMUSCULAR | Status: AC
  Administered 2023-05-05: 30 mg via INTRAMUSCULAR
  Filled 2023-05-05: qty 2

## 2023-05-05 MED ORDER — MELOXICAM 15 MG PO TABS: 15.0000 mg | ORAL_TABLET | Freq: Every day | ORAL | 0 refills | Status: AC

## 2023-05-05 NOTE — ED Provider Notes (Signed)
[place] [facility]ERGENCY DE[facility]MENT AT [facility] [place] Provider Note   CSN: [id number] Arrival date & time: 05/04/23  2130     History  No chief complaint on file.   Diane Sandoval is a 32 y.o. female.  32 year old female who presents ER today secondary to left knee pain.  Patient states that she was going to step off a step felt her cell phone so she put her left leg out to landed she landed on her foot but had knee pain afterwards.  She states it hurts to bend it in the posterior area.  Did not land on her knee directly.  No history of the same.  Ambulate but with quite a bit of difficulty secondary to the pain.        Home Medications Prior to Admission medications   Medication Sig Start Date End Date Taking? Authorizing Provider  meloxicam (MOBIC) 15 MG tablet Take 1 tablet (15 mg total) by mouth daily. 05/05/23  Yes Lautaro Koral, Barbara Cower, MD  ALPRAZolam Prudy Feeler) 0.5 MG tablet Take 0.5 mg by mouth at bedtime as needed for anxiety.    [provider]  DULoxetine (CYMBALTA) 30 MG capsule Take 30 mg by mouth daily.    [provider]  Elastic Bandages & Supports (FINGER COTS) MISC Use as needed for application of medication to rectal area. 06/11/21   Molpus, John, MD  fluticasone (FLONASE) 50 MCG/ACT nasal spray Place 1-2 sprays into both nostrils daily. 02/20/21   Wieters, Hallie C, PA-C  hydrocortisone (ANUSOL-HC) 2.5 % rectal cream Place 1 application rectally 2 (two) times daily. 06/11/21   Molpus, John, MD  ibuprofen (ADVIL) 600 MG tablet Take 1 tablet (600 mg total) by mouth every 6 (six) hours as needed. 02/20/21   Wieters, Hallie C, PA-C  Levonorgestrel (KYLEENA IU) by Intrauterine route.    [provider]  levothyroxine (SYNTHROID) 100 MCG tablet Take 100 mcg by mouth daily before breakfast.    [provider]  Lidocaine 5 % CREA Apply to rectal area as needed for pain. 06/11/21   Molpus, John, MD  valACYclovir (VALTREX) 500 MG tablet Take 500 mg  by mouth 2 (two) times daily.    [provider]      Allergies    Clindamycin/lincomycin and Doxycycline    Review of Systems   Review of Systems  Physical Exam Updated Vital Signs BP (!) 129/99   Pulse 86   Temp 98 F (36.7 C) (Oral)   Resp 20   Ht 5\' 4"  (1.626 m)   Wt 131.5 kg   SpO2 99%   BMI 49.78 kg/m  Physical Exam Vitals and nursing note reviewed.  Constitutional:      Appearance: She is well-developed.  HENT:     Head: Normocephalic and atraumatic.  Cardiovascular:     Rate and Rhythm: Normal rate and regular rhythm.     Comments: Normal cap refill distal the and left knee and palpable DP/PT pulses. Pulmonary:     Effort: No respiratory distress.     Breath sounds: No stridor.  Abdominal:     General: There is no distension.  Musculoskeletal:        General: Tenderness (Mild tenderness to her left lateral knee.  Ligament intact.  Strength with knee flexion is symmetric.) present. No swelling or deformity.     Cervical back: Normal range of motion.  Neurological:     Mental Status: She is alert.     ED Results / Procedures /  Treatments   Labs (all labs ordered are listed, but only abnormal results are displayed) Labs Reviewed - No data to display  EKG None  Radiology DG Knee Complete 4 Views Left  Result Date: 05/04/2023 CLINICAL DATA:  Select, pain EXAM: LEFT KNEE - COMPLETE 4+ VIEW COMPARISON:  11/04/2020 FINDINGS: Frontal, bilateral oblique, lateral views of the left knee are obtained. No fracture, subluxation, or dislocation. Joint spaces are well preserved. No joint effusion. Soft tissues are unremarkable. IMPRESSION: 1. Unremarkable left knee. Electronically Signed   By: Sharlet Salina M.D.   On: 05/04/2023 23:11    Procedures Procedures    Medications Ordered in ED Medications  ketorolac (TORADOL) injection 30 mg (has no administration in time range)  oxyCODONE-acetaminophen (PERCOCET/ROXICET) 5-325 MG per tablet 2 tablet (has no  administration in time range)    ED Course/ Medical Decision Making/ A&P                                 Medical Decision Making Amount and/or Complexity of Data Reviewed Radiology: ordered.  Risk Prescription drug management.   Suspect mild knee sprain will immobilize and give crutches and discussed RICE therapy with her. FU w/ PCP in a week if not improving. Here if things worsen.   Final Clinical Impression(s) / ED Diagnoses Final diagnoses:  Sprain of left knee, unspecified ligament, initial encounter    Rx / DC Orders ED Discharge Orders          Ordered    meloxicam (MOBIC) 15 MG tablet  Daily        05/05/23 0031              Hassaan Crite, Barbara Cower, MD 05/05/23 0159

## 2023-05-05 NOTE — ED Notes (Signed)
Discharge instructions reviewed with patient. Patient questions answered and opportunity for education reviewed. Patient voices understanding of discharge instructions with no further questions. Patient to lobby via wheelchair. 

## 2023-12-07 ENCOUNTER — Encounter (HOSPITAL_COMMUNITY): Payer: Self-pay

## 2023-12-07 ENCOUNTER — Emergency Department (HOSPITAL_COMMUNITY)
Admission: EM | Admit: 2023-12-07 | Discharge: 2023-12-07 | Disposition: A | Discharge status: Home / Self Care | Payer: PRIVATE HEALTH INSURANCE | Attending: Emergency Medicine | Admitting: Emergency Medicine

## 2023-12-07 ENCOUNTER — Other Ambulatory Visit: Payer: Self-pay

## 2023-12-07 ENCOUNTER — Emergency Department (HOSPITAL_COMMUNITY): Payer: PRIVATE HEALTH INSURANCE

## 2023-12-07 DIAGNOSIS — H538 Other visual disturbances: Secondary | ICD-10-CM | POA: Diagnosis present

## 2023-12-07 DIAGNOSIS — R531 Weakness: Secondary | ICD-10-CM | POA: Diagnosis not present

## 2023-12-07 DIAGNOSIS — R519 Headache, unspecified: Secondary | ICD-10-CM | POA: Diagnosis not present

## 2023-12-07 LAB — CBC WITH DIFFERENTIAL/PLATELET
Abs Immature Granulocytes: 0.02 10*3/uL (ref 0.00–0.07)
Basophils Absolute: 0 10*3/uL (ref 0.0–0.1)
Basophils Relative: 0 %
Eosinophils Absolute: 0 10*3/uL (ref 0.0–0.5)
Eosinophils Relative: 0 %
HCT: 37.9 % (ref 36.0–46.0)
Hemoglobin: 12.9 g/dL (ref 12.0–15.0)
Immature Granulocytes: 0 %
Lymphocytes Relative: 30 %
Lymphs Abs: 1.9 10*3/uL (ref 0.7–4.0)
MCH: 30.9 pg (ref 26.0–34.0)
MCHC: 34 g/dL (ref 30.0–36.0)
MCV: 90.9 fL (ref 80.0–100.0)
Monocytes Absolute: 0.5 10*3/uL (ref 0.1–1.0)
Monocytes Relative: 8 %
Neutro Abs: 3.8 10*3/uL (ref 1.7–7.7)
Neutrophils Relative %: 62 %
Platelets: 250 10*3/uL (ref 150–400)
RBC: 4.17 MIL/uL (ref 3.87–5.11)
RDW: 12.4 % (ref 11.5–15.5)
WBC: 6.3 10*3/uL (ref 4.0–10.5)
nRBC: 0 % (ref 0.0–0.2)

## 2023-12-07 LAB — BASIC METABOLIC PANEL WITH GFR
Anion gap: 10 (ref 5–15)
BUN: 10 mg/dL (ref 6–20)
CO2: 21 mmol/L — ABNORMAL LOW (ref 22–32)
Calcium: 9.2 mg/dL (ref 8.9–10.3)
Chloride: 105 mmol/L (ref 98–111)
Creatinine, Ser: 0.76 mg/dL (ref 0.44–1.00)
GFR, Estimated: >60 mL/min (ref >60)
Glucose, Bld: 93 mg/dL (ref 70–99)
Potassium: 4.5 mmol/L (ref 3.5–5.1)
Sodium: 136 mmol/L (ref 135–145)

## 2023-12-07 LAB — HCG, SERUM, QUALITATIVE: Preg, Serum: NEGATIVE

## 2023-12-07 LAB — ACETAMINOPHEN LEVEL: Acetaminophen (Tylenol), Serum: 13 ug/mL (ref 10–30)

## 2023-12-07 MED ORDER — PROCHLORPERAZINE EDISYLATE 10 MG/2ML IJ SOLN
10.0000 mg | Freq: Once | INTRAMUSCULAR | Status: AC
  Administered 2023-12-07: 10 mg via INTRAVENOUS
  Filled 2023-12-07: qty 2

## 2023-12-07 MED ORDER — OXYCODONE HCL 5 MG PO TABS
5.0000 mg | ORAL_TABLET | Freq: Once | ORAL | Status: AC
  Administered 2023-12-07: 5 mg via ORAL
  Filled 2023-12-07: qty 1

## 2023-12-07 MED ORDER — SODIUM CHLORIDE 0.9 % IV BOLUS
1000.0000 mL | Freq: Once | INTRAVENOUS | Status: AC
  Administered 2023-12-07: 1000 mL via INTRAVENOUS

## 2023-12-07 MED ORDER — DIPHENHYDRAMINE HCL 50 MG/ML IJ SOLN
25.0000 mg | Freq: Once | INTRAMUSCULAR | Status: AC
  Administered 2023-12-07: 25 mg via INTRAVENOUS
  Filled 2023-12-07: qty 1

## 2023-12-07 MED ORDER — ACETAMINOPHEN 500 MG PO TABS
1000.0000 mg | ORAL_TABLET | Freq: Once | ORAL | Status: AC
  Administered 2023-12-07: 1000 mg via ORAL
  Filled 2023-12-07: qty 2

## 2023-12-07 NOTE — ED Provider Triage Note (Signed)
 Emergency Medicine Provider Triage Evaluation Note  Diane Sandoval , a 33 y.o. female  was evaluated in triage.  Pt complains of dental pain and headache that have been ongoing for the past day.  She states she got teeth removed and dentures placed 3 days ago.  Denies any fever or chills.  Denies any difficulty breathing or swallowing.  States she is on ibuprofen and Tylenol around-the-clock as well as an antibiotic.  Review of Systems  Positive: As above Negative: As above  Physical Exam  BP 126/80   Pulse 74   Temp 97.8 F (36.6 C) (Oral)   Resp 15   SpO2 98%  Gen:   Awake, no distress   Resp:  Normal effort  MSK:   Moves extremities without difficulty    Medical Decision Making  Medically screening exam initiated at 5:09 PM.  Appropriate orders placed.  Laryn Venning was informed that the remainder of the evaluation will be completed by another provider, this initial triage assessment does not replace that evaluation, and the importance of remaining in the ED until their evaluation is complete.     Arabella Merles, PA-C 12/07/23 1712

## 2023-12-07 NOTE — ED Triage Notes (Signed)
 Pt bib ems c.o nausea, generalized weakness and headache that started around 1 pm today. Pt had some teeth pulled last week.

## 2023-12-07 NOTE — Discharge Instructions (Signed)
 Please help with your family doctor in the office.  Please let your oral surgeon know about your sudden worsening pain.  Please return to the Emergency Department for sudden worsening headache one-sided numbness or weakness or difficulty with speech or swallowing.

## 2023-12-07 NOTE — ED Provider Notes (Signed)
 [place] EMERGENCY DEPARTMENT AT [facility] Provider Note   CSN: [id number] Arrival date & time: 12/07/23  1700     History  Chief Complaint  Patient presents with   Nausea   Weakness    Diane Sandoval is a 33 y.o. female.  33 yo F with a cc of headache blurry vision and diffuse weakness.  She tells me that she had all of her teeth pulled about 3 days ago.  She then had dentures placed.  Has been having pain from that and has been taking medicines around-the-clock except for today she did not take any of her pain medicine.  She went to take a nap and when she woke up with a sudden headache she felt like she had trouble with her vision that was very blurry with both eyes and she was able to call her parents who encouraged her to call 911.  Feels like her vision has gotten much better.  She feels globally weak all over.  Denies any fevers or chills.   Weakness      Home Medications Prior to Admission medications   Medication Sig Start Date End Date Taking? Authorizing Provider  ALPRAZolam Prudy Feeler) 0.5 MG tablet Take 0.5 mg by mouth at bedtime as needed for anxiety.    [provider]  DULoxetine (CYMBALTA) 30 MG capsule Take 30 mg by mouth daily.    [provider]  Elastic Bandages & Supports (FINGER COTS) MISC Use as needed for application of medication to rectal area. 06/11/21   Molpus, John, MD  fluticasone (FLONASE) 50 MCG/ACT nasal spray Place 1-2 sprays into both nostrils daily. 02/20/21   Wieters, Hallie C, PA-C  hydrocortisone (ANUSOL-HC) 2.5 % rectal cream Place 1 application rectally 2 (two) times daily. 06/11/21   Molpus, John, MD  ibuprofen (ADVIL) 600 MG tablet Take 1 tablet (600 mg total) by mouth every 6 (six) hours as needed. 02/20/21   Wieters, Hallie C, PA-C  Levonorgestrel (KYLEENA IU) by Intrauterine route.    [provider]  levothyroxine (SYNTHROID) 100 MCG tablet Take 100 mcg by mouth daily before breakfast.    [provider]  Lidocaine 5 % CREA Apply to rectal area as needed for pain. 06/11/21   Molpus, John, MD  meloxicam (MOBIC) 15 MG tablet Take 1 tablet (15 mg total) by mouth daily. 05/05/23   Mesner, Barbara Cower, MD  valACYclovir (VALTREX) 500 MG tablet Take 500 mg by mouth 2 (two) times daily.    [provider]      Allergies    Clindamycin/lincomycin and Doxycycline    Review of Systems   Review of Systems  Neurological:  Positive for weakness.    Physical Exam Updated Vital Signs BP 126/80   Pulse 74   Temp 97.8 F (36.6 C) (Oral)   Resp 15   SpO2 98%  Physical Exam Vitals and nursing note reviewed.  Constitutional:      General: She is not in acute distress.    Appearance: She is well-developed. She is not diaphoretic.  HENT:     Head: Normocephalic and atraumatic.  Eyes:     Pupils: Pupils are equal, round, and reactive to light.  Cardiovascular:     Rate and Rhythm: Normal rate and regular rhythm.     Heart sounds: No murmur heard.    No friction rub. No gallop.  Pulmonary:     Effort: Pulmonary effort is normal.     Breath sounds: No wheezing or  rales.  Abdominal:     General: There is no distension.     Palpations: Abdomen is soft.     Tenderness: There is no abdominal tenderness.  Musculoskeletal:        General: No tenderness.     Cervical back: Normal range of motion and neck supple.  Skin:    General: Skin is warm and dry.  Neurological:     Mental Status: She is alert and oriented to person, place, and time.     GCS: GCS eye subscore is 4. GCS verbal subscore is 5. GCS motor subscore is 6.     Cranial Nerves: Cranial nerves 2-12 are intact.     Sensory: Sensation is intact.     Motor: Motor function is intact.     Coordination: Coordination is intact.     Comments: Benign neuro exam  Psychiatric:        Behavior: Behavior normal.     ED Results / Procedures / Treatments   Labs (all labs ordered are listed, but only abnormal results are  displayed) Labs Reviewed  BASIC METABOLIC PANEL WITH GFR - Abnormal; Notable for the following components:      Result Value   CO2 21 (*)    All other components within normal limits  CBC WITH DIFFERENTIAL/PLATELET  HCG, SERUM, QUALITATIVE  ACETAMINOPHEN LEVEL    EKG None  Radiology CT Head Wo Contrast Result Date: 12/07/2023 CLINICAL DATA:  Headache EXAM: CT HEAD WITHOUT CONTRAST TECHNIQUE: Contiguous axial images were obtained from the base of the skull through the vertex without intravenous contrast. RADIATION DOSE REDUCTION: This exam was performed according to the departmental dose-optimization program which includes automated exposure control, adjustment of the mA and/or kV according to patient size and/or use of iterative reconstruction technique. COMPARISON:  None Available. FINDINGS: Brain: No acute territorial infarction, hemorrhage or intracranial mass. The ventricles are nonenlarged Vascular: No hyperdense vessel or unexpected calcification. Skull: No fracture.  Suspected anterior cranioplasty changes. Sinuses/Orbits: No acute finding. Other: None IMPRESSION: No CT evidence for acute intracranial abnormality Electronically Signed   By: Jasmine Pang M.D.   On: 12/07/2023 19:46    Procedures Procedures    Medications Ordered in ED Medications  sodium chloride 0.9 % bolus 1,000 mL (0 mLs Intravenous Stopped 12/07/23 2014)  prochlorperazine (COMPAZINE) injection 10 mg (10 mg Intravenous Given 12/07/23 1842)  diphenhydrAMINE (BENADRYL) injection 25 mg (25 mg Intravenous Given 12/07/23 1842)  acetaminophen (TYLENOL) tablet 1,000 mg (1,000 mg Oral Given 12/07/23 1830)  oxyCODONE (Oxy IR/ROXICODONE) immediate release tablet 5 mg (5 mg Oral Given 12/07/23 1830)    ED Course/ Medical Decision Making/ A&P                                 Medical Decision Making Amount and/or Complexity of Data Reviewed Labs: ordered. Radiology: ordered. ECG/medicine tests: ordered.  Risk OTC  drugs. Prescription drug management.   33 yo F with a chief complaints of sudden onset headache dimming of her vision.  This happened when she woke up from a nap today.  Patient had recently had most of her teeth pulled.  On my exam patient is well-appearing nontoxic.  By history of suspects that the patient perhaps had a near syncopal events.  Regardless with patient endorsing a sudden onset severe headache with neurologic symptoms we will obtain a CT of the head.  Will give a headache cocktail here.  Blood work.  Reassess.  Blood work without significant electrolyte abnormalities.  Pregnancy test negative.  CT of the head done within 6 hours of onset of headache is negative for acute subarachnoid hemorrhage.  Patient feeling much better after headache cocktail.  She would like to go home.  PCP follow-up.  8:15 PM:  I have discussed the diagnosis/risks/treatment options with the patient.  Evaluation and diagnostic testing in the emergency department does not suggest an emergent condition requiring admission or immediate intervention beyond what has been performed at this time.  They will follow up with PCP. We also discussed returning to the ED immediately if new or worsening sx occur. We discussed the sx which are most concerning (e.g., sudden worsening pain, fever, inability to tolerate by mouth) that necessitate immediate return. Medications administered to the patient during their visit and any new prescriptions provided to the patient are listed below.  Medications given during this visit Medications  sodium chloride 0.9 % bolus 1,000 mL (0 mLs Intravenous Stopped 12/07/23 2014)  prochlorperazine (COMPAZINE) injection 10 mg (10 mg Intravenous Given 12/07/23 1842)  diphenhydrAMINE (BENADRYL) injection 25 mg (25 mg Intravenous Given 12/07/23 1842)  acetaminophen (TYLENOL) tablet 1,000 mg (1,000 mg Oral Given 12/07/23 1830)  oxyCODONE (Oxy IR/ROXICODONE) immediate release tablet 5 mg (5 mg Oral Given  12/07/23 1830)     The patient appears reasonably screen and/or stabilized for discharge and I doubt any other medical condition or other [facility] requiring further screening, evaluation, or treatment in the ED at this time prior to discharge.          Final Clinical Impression(s) / ED Diagnoses Final diagnoses:  Blurry vision  Sudden onset of severe headache    Rx / DC Orders ED Discharge Orders     None         Melene Plan, DO 12/07/23 2015

## 2024-03-17 ENCOUNTER — Other Ambulatory Visit: Payer: Self-pay

## 2024-03-17 ENCOUNTER — Encounter (HOSPITAL_BASED_OUTPATIENT_CLINIC_OR_DEPARTMENT_OTHER): Payer: Self-pay | Admitting: Radiology

## 2024-03-17 ENCOUNTER — Emergency Department (HOSPITAL_BASED_OUTPATIENT_CLINIC_OR_DEPARTMENT_OTHER): Payer: PRIVATE HEALTH INSURANCE

## 2024-03-17 ENCOUNTER — Emergency Department (HOSPITAL_BASED_OUTPATIENT_CLINIC_OR_DEPARTMENT_OTHER)
Admission: EM | Admit: 2024-03-17 | Discharge: 2024-03-17 | Disposition: A | Discharge status: Home / Self Care | Payer: PRIVATE HEALTH INSURANCE | Attending: Emergency Medicine | Admitting: Emergency Medicine

## 2024-03-17 DIAGNOSIS — X58XXXA Exposure to other specified factors, initial encounter: Secondary | ICD-10-CM | POA: Diagnosis not present

## 2024-03-17 DIAGNOSIS — S3992XA Unspecified injury of lower back, initial encounter: Secondary | ICD-10-CM | POA: Diagnosis present

## 2024-03-17 DIAGNOSIS — R1032 Left lower quadrant pain: Secondary | ICD-10-CM | POA: Insufficient documentation

## 2024-03-17 DIAGNOSIS — S39012A Strain of muscle, fascia and tendon of lower back, initial encounter: Secondary | ICD-10-CM | POA: Insufficient documentation

## 2024-03-17 LAB — CBC
HCT: 37.5 % (ref 36.0–46.0)
Hemoglobin: 12.7 g/dL (ref 12.0–15.0)
MCH: 31.2 pg (ref 26.0–34.0)
MCHC: 33.9 g/dL (ref 30.0–36.0)
MCV: 92.1 fL (ref 80.0–100.0)
Platelets: 257 K/uL (ref 150–400)
RBC: 4.07 MIL/uL (ref 3.87–5.11)
RDW: 13.2 % (ref 11.5–15.5)
WBC: 6.6 K/uL (ref 4.0–10.5)
nRBC: 0 % (ref 0.0–0.2)

## 2024-03-17 LAB — COMPREHENSIVE METABOLIC PANEL WITH GFR
ALT: 78 U/L — ABNORMAL HIGH (ref 0–44)
AST: 46 U/L — ABNORMAL HIGH (ref 15–41)
Albumin: 4 g/dL (ref 3.5–5.0)
Alkaline Phosphatase: 102 U/L (ref 38–126)
Anion gap: 12 (ref 5–15)
BUN: 9 mg/dL (ref 6–20)
CO2: 25 mmol/L (ref 22–32)
Calcium: 9.2 mg/dL (ref 8.9–10.3)
Chloride: 102 mmol/L (ref 98–111)
Creatinine, Ser: 0.76 mg/dL (ref 0.44–1.00)
GFR, Estimated: >60 mL/min (ref >60)
Glucose, Bld: 97 mg/dL (ref 70–99)
Potassium: 4.3 mmol/L (ref 3.5–5.1)
Sodium: 138 mmol/L (ref 135–145)
Total Bilirubin: 0.3 mg/dL (ref 0.0–1.2)
Total Protein: 6.9 g/dL (ref 6.5–8.1)

## 2024-03-17 LAB — URINALYSIS, ROUTINE W REFLEX MICROSCOPIC
Bilirubin Urine: NEGATIVE
Glucose, UA: NEGATIVE mg/dL
Hgb urine dipstick: NEGATIVE
Ketones, ur: NEGATIVE mg/dL
Leukocytes,Ua: NEGATIVE
Nitrite: NEGATIVE
Protein, ur: NEGATIVE mg/dL
Specific Gravity, Urine: 1.02 (ref 1.005–1.030)
pH: 7 (ref 5.0–8.0)

## 2024-03-17 LAB — PREGNANCY, URINE: Preg Test, Ur: NEGATIVE

## 2024-03-17 LAB — LIPASE, BLOOD: Lipase: 26 U/L (ref 11–51)

## 2024-03-17 MED ORDER — OXYCODONE-ACETAMINOPHEN 5-325 MG PO TABS
1.0000 | ORAL_TABLET | Freq: Once | ORAL | Status: AC
  Administered 2024-03-17: 1 via ORAL
  Filled 2024-03-17: qty 1

## 2024-03-17 MED ORDER — IBUPROFEN 800 MG PO TABS: 800.0000 mg | ORAL_TABLET | Freq: Three times a day (TID) | ORAL | 0 refills | Status: AC | PRN

## 2024-03-17 MED ORDER — METHOCARBAMOL 500 MG PO TABS: 500.0000 mg | ORAL_TABLET | Freq: Two times a day (BID) | ORAL | 0 refills | Status: AC

## 2024-03-17 MED ORDER — METHOCARBAMOL 500 MG PO TABS
750.0000 mg | ORAL_TABLET | Freq: Once | ORAL | Status: AC
  Administered 2024-03-17: 750 mg via ORAL
  Filled 2024-03-17: qty 2

## 2024-03-17 NOTE — ED Notes (Signed)
 IV attempt x1 left AC, unsuccessful, blood obtained.  Dr Dirk aware, hold off on further IV attempts at this time.

## 2024-03-17 NOTE — ED Provider Notes (Signed)
 [place] [facility]ER[facility]ENCY DEPARTMENT AT [facility] [place] Provider Note   CSN: [id number] Arrival date & time: 03/17/24  1529     Patient presents with: Abdominal Pain and Back Pain   Diane Sandoval is a 33 y.o. female.   Patient is a 33 year old female presenting for back pain.  Patient mitts to midline low back pain that radiates to the left lower back and left lower abdomen.  She states the pain started this morning.  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  She has a previous history of sciatica and states this feels differently.  She denies any diarrhea, hematochezia, melena.  She denies any dysuria, increased frequency or urgency.  Denies any flank pain or hematuria.  She denies any fevers, chills, history of malignancy, or any other bony pathologies.  Denies any falls or injuries.  Denies any sensation or motor dysfunction in the lower extremities.  Denies any numbness or tingling in the perineal region.  The history is provided by the patient. No language interpreter was used.  Abdominal Pain Associated symptoms: no chest pain, no chills, no cough, no dysuria, no fever, no hematuria, no shortness of breath, no sore throat and no vomiting   Back Pain Associated symptoms: abdominal pain   Associated symptoms: no chest pain, no dysuria and no fever        Prior to Admission medications   Medication Sig Start Date End Date Taking? Authorizing Provider  ibuprofen  (ADVIL ) 800 MG tablet Take 1 tablet (800 mg total) by mouth every 8 (eight) hours as needed for moderate pain (pain score 4-6). 03/17/24  Yes Elnor Hila P, DO  methocarbamol  (ROBAXIN ) 500 MG tablet Take 1 tablet (500 mg total) by mouth 2 (two) times daily. 03/17/24  Yes Elnor Hila P, DO  ALPRAZolam (XANAX) 0.5 MG tablet Take 0.5 mg by mouth at bedtime as needed for anxiety.    [provider]  DULoxetine (CYMBALTA) 30 MG capsule Take 30 mg by mouth daily.    [provider]   Elastic Bandages & Supports (FINGER COTS) MISC Use as needed for application of medication to rectal area. 06/11/21   Molpus, John, MD  fluticasone  (FLONASE ) 50 MCG/ACT nasal spray Place 1-2 sprays into both nostrils daily. 02/20/21   Wieters, Hallie C, PA-C  hydrocortisone  (ANUSOL -HC) 2.5 % rectal cream Place 1 application rectally 2 (two) times daily. 06/11/21   Molpus, John, MD  Levonorgestrel (KYLEENA IU) by Intrauterine route.    [provider]  levothyroxine (SYNTHROID) 100 MCG tablet Take 100 mcg by mouth daily before breakfast.    [provider]  Lidocaine  5 % CREA Apply to rectal area as needed for pain. 06/11/21   Molpus, John, MD  meloxicam  (MOBIC ) 15 MG tablet Take 1 tablet (15 mg total) by mouth daily. 05/05/23   Mesner, Jason, MD  valACYclovir (VALTREX) 500 MG tablet Take 500 mg by mouth 2 (two) times daily.    [provider]    Allergies: Clindamycin/lincomycin and Doxycycline    Review of Systems  Constitutional:  Negative for chills and fever.  HENT:  Negative for ear pain and sore throat.   Eyes:  Negative for pain and visual disturbance.  Respiratory:  Negative for cough and shortness of breath.   Cardiovascular:  Negative for chest pain and palpitations.  Gastrointestinal:  Positive for abdominal pain. Negative for vomiting.  Genitourinary:  Negative for dysuria and hematuria.  Musculoskeletal:  Positive for back  pain. Negative for arthralgias.  Skin:  Negative for color change and rash.  Neurological:  Negative for seizures and syncope.  All other systems reviewed and are negative.   Updated Vital Signs BP (!) 142/96   Pulse 91   Temp 99.1 F (37.3 C) (Oral)   Resp 20   Ht 5' 4 (1.626 m)   Wt (!) 138.3 kg   SpO2 98%   BMI 52.35 kg/m   Physical Exam Vitals and nursing note reviewed.  Constitutional:      General: She is not in acute distress.    Appearance: She is well-developed.  HENT:     Head: Normocephalic and atraumatic.   Eyes:     Conjunctiva/sclera: Conjunctivae normal.  Cardiovascular:     Rate and Rhythm: Normal rate and regular rhythm.     Heart sounds: No murmur heard. Pulmonary:     Effort: Pulmonary effort is normal. No respiratory distress.     Breath sounds: Normal breath sounds.  Abdominal:     Palpations: Abdomen is soft.     Tenderness: There is no abdominal tenderness.  Musculoskeletal:        General: No swelling.     Cervical back: Neck supple. No tenderness or bony tenderness.     Thoracic back: No tenderness or bony tenderness.     Lumbar back: Bony tenderness present. No tenderness.       Back:     Comments: Midline spinal pain low back L5-S1 region.  No paraspinal muscle tenderness.  No tenderness over palpation of the iliac crest.  No tenderness to palpation of the lower ribs on the left side.  Skin:    General: Skin is warm and dry.     Capillary Refill: Capillary refill takes less than 2 seconds.     Findings: No rash.     Comments: No rashes or skin color changes overlying the lower back  Neurological:     Mental Status: She is alert.     GCS: GCS eye subscore is 4. GCS verbal subscore is 5. GCS motor subscore is 6.     Sensory: Sensation is intact.     Motor: Motor function is intact.  Psychiatric:        Mood and Affect: Mood normal.     (all labs ordered are listed, but only abnormal results are displayed) Labs Reviewed  COMPREHENSIVE METABOLIC PANEL WITH GFR - Abnormal; Notable for the following components:      Result Value   AST 46 (*)    ALT 78 (*)    All other components within normal limits  LIPASE, BLOOD  CBC  URINALYSIS, ROUTINE W REFLEX MICROSCOPIC  PREGNANCY, URINE    EKG: None  Radiology: CT Lumbar Spine Wo Contrast Result Date: 03/17/2024 CLINICAL DATA:  L5/S1 midline back pain, acute EXAM: CT LUMBAR SPINE WITHOUT CONTRAST TECHNIQUE: Multidetector CT imaging of the lumbar spine was performed without intravenous contrast administration.  Multiplanar CT image reconstructions were also generated. RADIATION DOSE REDUCTION: This exam was performed according to the departmental dose-optimization program which includes automated exposure control, adjustment of the mA and/or kV according to patient size and/or use of iterative reconstruction technique. COMPARISON:  None Available. FINDINGS: Segmentation: 5 lumbar type vertebrae. Alignment: Normal. Vertebrae: Left L5-S1 pseudoarthrosis. No acute fracture or focal pathologic process. Paraspinal and other soft tissues: Negative. Disc levels: Mild L5-S1 intervertebral disc space narrowing. IMPRESSION: No acute displaced fracture or traumatic listhesis of the lumbar spine. Electronically Signed  By: Morgane  Naveau M.D.   On: 03/17/2024 18:35     Procedures   Medications Ordered in the ED  oxyCODONE -acetaminophen  (PERCOCET/ROXICET) 5-325 MG per tablet 1 tablet (1 tablet Oral Given 03/17/24 1818)  methocarbamol  (ROBAXIN ) tablet 750 mg (750 mg Oral Given 03/17/24 1929)                                    Medical Decision Making Amount and/or Complexity of Data Reviewed Labs: ordered. Radiology: ordered.  Risk Prescription drug management.   33 year old female presenting for nontraumatic acute onset low midline back pain.  On exam she is hypertensive at 161/120 with otherwise stable vital signs.  She is afebrile without signs or symptoms of toxicity or sepsis.  Her skin exam demonstrates no rashes or wounds.  She has midline tenderness without any paraspinal tenderness.  No flank pain.  No abdominal pain.  No neurovascular deficits.  No signs of cauda equina syndrome, spinal epidural abscess, transverse myelitis, history of trauma.  Chart review demonstrates no previous visits for this case.  Family history and past medical history demonstrates no prior history of malignancies.  No night sweats, unexpected weight changes, or concerning signs for malignancy.  CT lumbar spine demonstrates no  acute process.  UA demonstrates no urinary tract infection.  Laboratory studies are stable.  Patient's symptoms likely secondary to musculoskeletal pain.  Symptoms improved after Robaxin .  Prescription sent to pharmacy with recommendations for follow-up with PCP.  Patient in no distress and overall condition improved here in the ED. Detailed discussions were had with the patient regarding current findings, and need for close f/u with PCP or on call doctor. The patient has been instructed to return immediately if the symptoms worsen in any way for re-evaluation. Patient verbalized understanding and is in agreement with current care plan. All questions answered prior to discharge.        Final diagnoses:  Strain of lumbar region, initial encounter    ED Discharge Orders          Ordered    methocarbamol  (ROBAXIN ) 500 MG tablet  2 times daily        03/17/24 2051    ibuprofen  (ADVIL ) 800 MG tablet  Every 8 hours PRN        03/17/24 2051               Elnor Bernarda SQUIBB, DO 03/17/24 2052

## 2024-03-17 NOTE — ED Notes (Signed)
 ED Provider at bedside.

## 2024-03-17 NOTE — ED Notes (Signed)
 Pt notes feeling better after medication admin.  MD notified.

## 2024-03-17 NOTE — ED Notes (Signed)
 Patient transported to CT

## 2024-03-17 NOTE — ED Triage Notes (Signed)
 Endorses back pain that radiates to her left lower abd. She endorses having an IUD. Sates she has been on lasixs for the past 5 days due to having leg and ankle swelling. She endorses taking tylenol  and aleve and it has not relieved her pain any. Endorses being very thirsty and not being able to get enough to drink, denies DM. Denies urinary symptoms.
